# Patient Record
Sex: Female | Born: 2000 | Race: White | Hispanic: No | Marital: Single | Smoking: Never smoker
Health system: Southern US, Community
[De-identification: ages and names within clinical notes are randomized; demographics above are authoritative.]

## PROBLEM LIST (undated history)

## (undated) DIAGNOSIS — L709 Acne, unspecified: Secondary | ICD-10-CM

## (undated) HISTORY — DX: Acne, unspecified: L70.9

---
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
## 2021-01-04 IMAGING — DX DG CHEST 1V
1 series · 1 of 1 positions shown · non-contrast
Comparison: None.

CLINICAL DATA: 18-year-old female with cough and abdominal pain.

EXAM:
CHEST  1 VIEW

[chest ap]
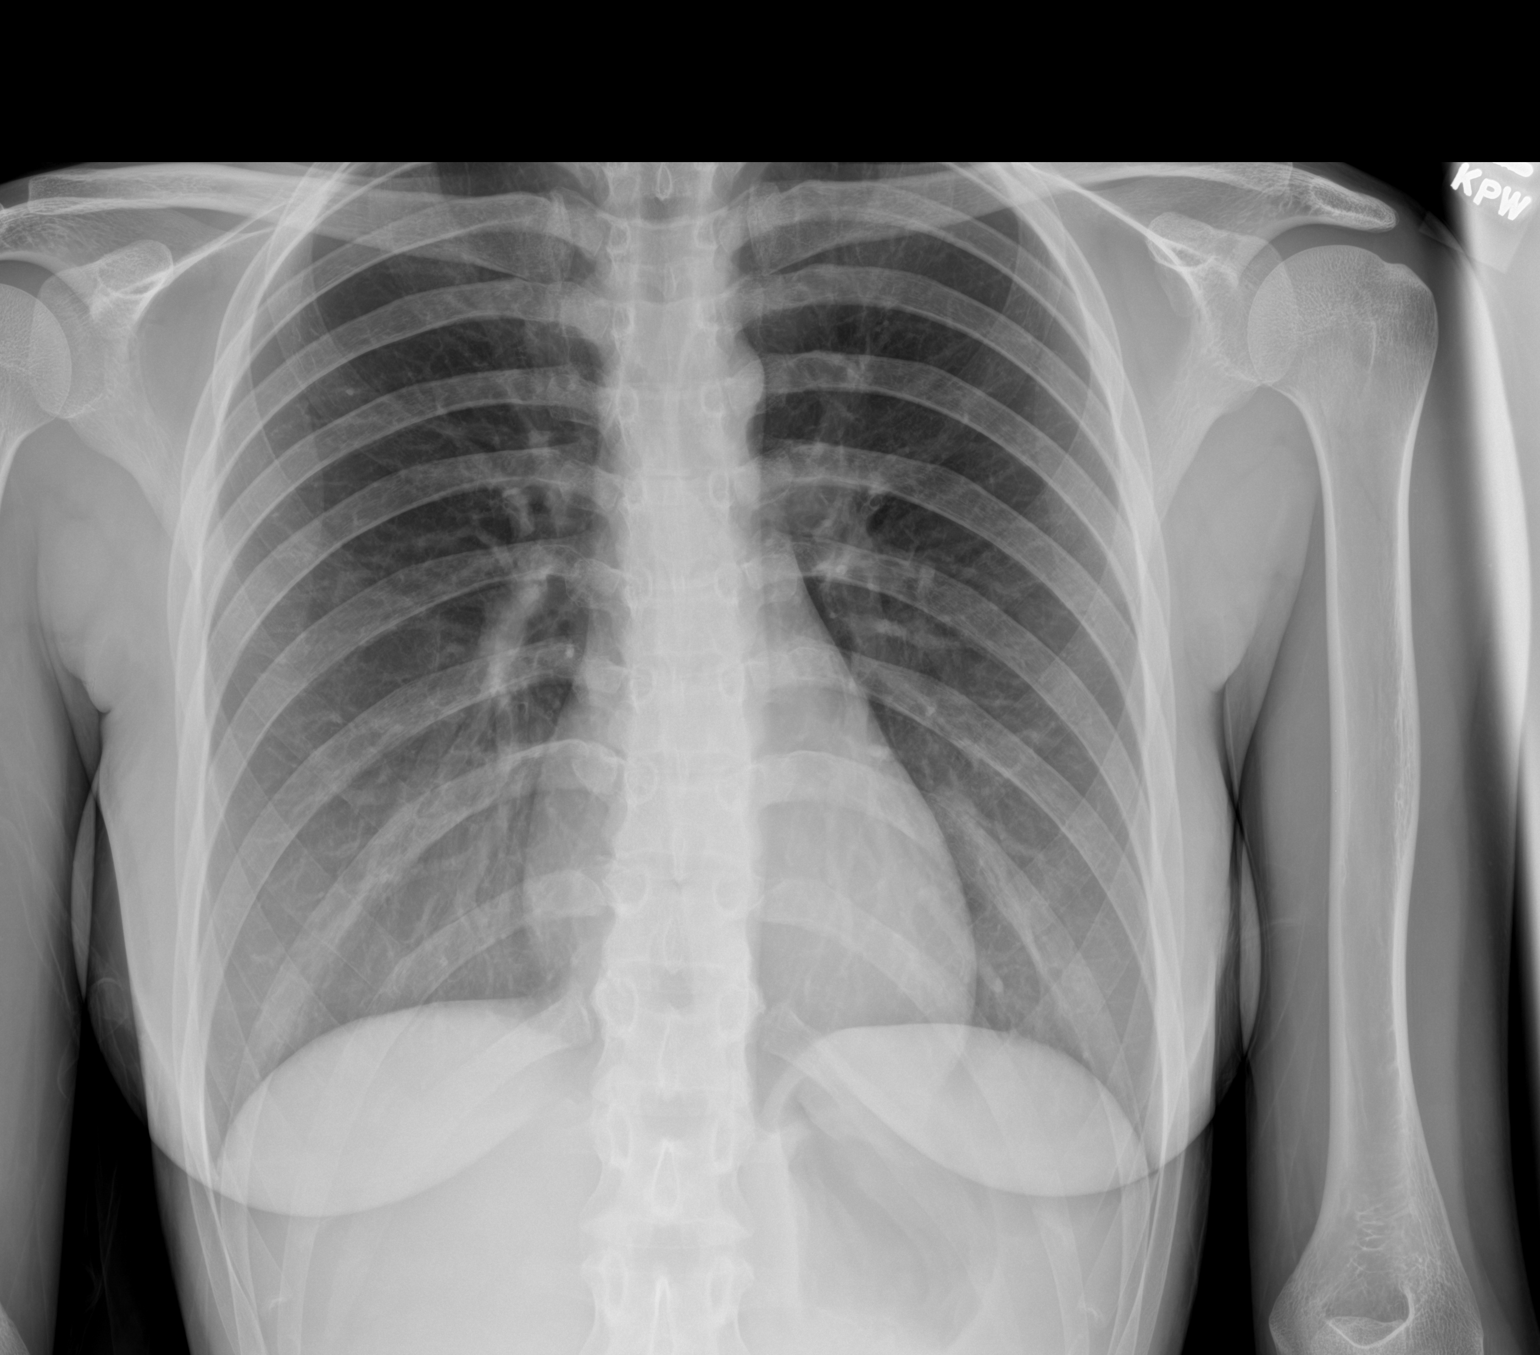

[1 of 1 positions shown; findings below may reference images not displayed]

FINDINGS: Portable AP upright view at 7173 hours. Normal lung volumes and
mediastinal contours. Visualized tracheal air column is within
normal limits. Both lungs appear clear. No pneumothorax or
pneumoperitoneum. Normal appearance of gastric air in the left upper
quadrant. No osseous abnormality identified.
IMPRESSION: Negative portable chest.

## 2023-11-17 ENCOUNTER — Emergency Department: Admission: EM | Admit: 2023-11-17 | Discharge: 2023-11-17 | Disposition: A | Discharge status: Home / Self Care

## 2023-11-17 ENCOUNTER — Other Ambulatory Visit: Payer: Self-pay

## 2023-11-17 DIAGNOSIS — T7840XA Allergy, unspecified, initial encounter: Secondary | ICD-10-CM | POA: Insufficient documentation

## 2023-11-17 MED ORDER — EPINEPHRINE 0.3 MG/0.3ML IJ SOAJ: 0.3000 mg | INTRAMUSCULAR | 0 refills | Status: AC | PRN

## 2023-11-17 MED ORDER — EPINEPHRINE 0.3 MG/0.3ML IJ SOAJ: 0.3000 mg | INTRAMUSCULAR | 0 refills | Status: DC | PRN

## 2023-11-17 NOTE — ED Notes (Signed)
 First nurse note: To ED from [facility] for allergic reaction since 12am this morning. Woke up feeling like could not breathe and tongue was swollen. Pt took benadryl at 1330 today and then went to [facility]. Pt appears to be in NAD with unlabored respirations.

## 2023-11-17 NOTE — ED Triage Notes (Signed)
 Pt sts that she feels like she is having an allergic reaction. Pt sts that it happened last night where she couldn't breath out of her mouth or swallow like something was stopping it. Pt sts that she is able to breath out of her nose just fine when this happens. Pt is resting at this time in triage. No signs of distress is noted.

## 2023-11-17 NOTE — ED Provider Notes (Signed)
 [facility] Provider Note    None    (approximate)   History   Allergic Reaction   HPI Evelyn Kelley is a 23 y.o. female presenting to the emergency department today following an allergic reaction.  States she had an episode last night where she felt like her throat was closing up and her tongue started getting really itchy and she was unable to swallow.  She also had another episode today around 1:30 pm. She gave me a list of her foods that she had eaten, and peanut butter crackers and cheese were listed prior to both of these occurrences.  Denies shortness of breath, nausea, vomiting, rash, fever, chest pain.  She did take Benadryl after the episode of tongue itching today, and reports it helped some.  She does not have a history of any allergies other than ibuprofen and has never used an EpiPen.      Physical Exam   Triage Vital Signs: ED Triage Vitals [11/17/23 1420]  Encounter Vitals Group     BP (!) 117/92     Systolic BP Percentile      Diastolic BP Percentile      Pulse Rate 90     Resp 17     Temp 98.2 F (36.8 C)     Temp Source Oral     SpO2 100 %     Weight 102 lb (46.3 kg)     Height 5\' 4"  (1.626 m)     Head Circumference      Peak Flow      Pain Score 0     Pain Loc      Pain Education      Exclude from Growth Chart     Most recent vital signs: Vitals:   11/17/23 1420  BP: (!) 117/92  Pulse: 90  Resp: 17  Temp: 98.2 F (36.8 C)  SpO2: 100%    General: Well-appearing, in no acute distress. Appears stated age. Head: Normocephalic, atraumatic. Ears/Nose/Throat: Oropharynx moist, no erythema or exudate. Dentition intact. Neck: Supple, no lymphadenopathy, no JVD, no nuchal rigidity. CV: Regular rate. No murmurs, rubs, or gallops. Peripheral pulses 2+ and symmetric. No edema. Respiratory: Breath sounds clear b/l. No wheezes, rales, or rhonchi. No respiratory distress. Normal respiratory effort. GI: Soft, non-distended,  non-tender. Skin:Warm, dry, intact. No rashes, lesions, or ecchymosis. No cyanosis or pallor. Neurological: A&Ox4 to person, place, time, and situation.  Psychiatric: Mood and affect appropriate. Thought processes coherent.    ED Results / Procedures / Treatments   Labs (all labs ordered are listed, but only abnormal results are displayed) Labs Reviewed - No data to display   EKG  Not ordered   RADIOLOGY  Not ordered   PROCEDURES:  Critical Care performed: No  Procedures   MEDICATIONS ORDERED IN ED: Medications - No data to display   IMPRESSION / MDM / ASSESSMENT AND PLAN / ED COURSE  I reviewed the triage vital signs and the nursing notes.                              Differential diagnosis includes, but is not limited to, allergic reaction, anaphylaxis, allergic rhinitis, GERD  Patient's presentation is most consistent with acute presentation with potential threat to life or bodily function.  Patient's presentation is most consistent with the allergic reaction.  Her symptoms do not reveal anaphylaxis.  Physical exam is benign.  I discussed taking Benadryl  and Pepcid together for the next couple of days until her tongue itching goes away. also instructed her not to eat anything with peanut butter or cheese in it.  She was given a prescription for EpiPen's, as well as education surrounding how to use them and their return precautions.  She should follow-up with her primary care provider as needed or if she wants to receive referral for allergy testing.  Emergency department return precautions were discussed with the patient.  Patient is in agreement to the treatment plan.  Patient is stable for discharge.      FINAL CLINICAL IMPRESSION(S) / ED DIAGNOSES   Final diagnoses:  Allergic reaction, initial encounter     Rx / DC Orders   ED Discharge Orders          Ordered    EPINEPHrine 0.3 mg/0.3 mL IJ SOAJ injection  As needed,   Status:  Discontinued         11/17/23 1640    EPINEPHrine 0.3 mg/0.3 mL IJ SOAJ injection  As needed        11/17/23 1652             Note:  This document was prepared using Dragon voice recognition software and may include unintentional dictation errors.    Thomasenia Flesher, PA-C 11/17/23 1709    Lubertha Rush, MD 11/21/23 [phone]

## 2023-11-17 NOTE — Discharge Instructions (Addendum)
 You have been seen in the Emergency Department (ED) today for an allergic reaction.  You have been stable throughout your stay in the Emergency Department.  Please pick up and take Benadryl and Pepcid as written on the bottle for the next few days until your tongue itching goes away.  Please follow-up with your primary care provider if you would like them to refer you for allergy testing.  I have prescribed you an EpiPen in case of emergencies. please keep your Epi-Pen with you at all times and use it if experience shortness of breath or difficulty breathing or if you believe you are having a severe allergic reaction.  If you use the Epi-Pen, though, please call 911 afterwards or go immediately to your nearest Emergency Department.  Return to the Emergency Department (ED) if you experience any worsening or new symptoms that concern you.
